# Patient Record
Sex: Male | Born: 1990 | Race: Black or African American | Hispanic: No | Marital: Single | Smoking: Current every day smoker
Health system: Southern US, Community
[De-identification: ages and names within clinical notes are randomized; demographics above are authoritative.]

---
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
## 2012-07-23 ENCOUNTER — Emergency Department: Payer: Self-pay | Admitting: Emergency Medicine

## 2012-07-23 LAB — URINALYSIS, COMPLETE
Bacteria: NONE SEEN
Bilirubin,UR: NEGATIVE
Glucose,UR: NEGATIVE mg/dL (ref 0–75)
Ketone: NEGATIVE
Leukocyte Esterase: NEGATIVE
Nitrite: NEGATIVE
Squamous Epithelial: <1

## 2013-03-11 ENCOUNTER — Emergency Department: Payer: Self-pay | Admitting: Internal Medicine

## 2013-03-11 LAB — HEMOGLOBIN: HGB: 14.6 g/dL (ref 13.0–18.0)

## 2013-03-19 ENCOUNTER — Emergency Department: Payer: Self-pay | Admitting: Emergency Medicine

## 2014-05-28 ENCOUNTER — Emergency Department
Admit: 2014-05-28 | Disposition: A | Discharge status: Home / Self Care | Payer: Self-pay | Admitting: Emergency Medicine

## 2014-12-26 ENCOUNTER — Encounter: Payer: Self-pay | Admitting: Emergency Medicine

## 2014-12-26 ENCOUNTER — Emergency Department
Admission: EM | Admit: 2014-12-26 | Discharge: 2014-12-26 | Disposition: A | Discharge status: Home / Self Care | Payer: No Typology Code available for payment source | Attending: Emergency Medicine | Admitting: Emergency Medicine

## 2014-12-26 ENCOUNTER — Emergency Department: Payer: No Typology Code available for payment source

## 2014-12-26 DIAGNOSIS — S6992XA Unspecified injury of left wrist, hand and finger(s), initial encounter: Secondary | ICD-10-CM | POA: Diagnosis present

## 2014-12-26 DIAGNOSIS — Y92410 Unspecified street and highway as the place of occurrence of the external cause: Secondary | ICD-10-CM | POA: Insufficient documentation

## 2014-12-26 DIAGNOSIS — Y9389 Activity, other specified: Secondary | ICD-10-CM | POA: Insufficient documentation

## 2014-12-26 DIAGNOSIS — Y998 Other external cause status: Secondary | ICD-10-CM | POA: Insufficient documentation

## 2014-12-26 DIAGNOSIS — F17210 Nicotine dependence, cigarettes, uncomplicated: Secondary | ICD-10-CM | POA: Diagnosis not present

## 2014-12-26 DIAGNOSIS — S0990XA Unspecified injury of head, initial encounter: Secondary | ICD-10-CM | POA: Diagnosis not present

## 2014-12-26 DIAGNOSIS — M25532 Pain in left wrist: Secondary | ICD-10-CM

## 2014-12-26 MED ORDER — OXYCODONE-ACETAMINOPHEN 5-325 MG PO TABS
2.0000 | ORAL_TABLET | Freq: Once | ORAL | Status: AC
  Administered 2014-12-26: 2 via ORAL

## 2014-12-26 MED ORDER — OXYCODONE-ACETAMINOPHEN 5-325 MG PO TABS: 1.0000 | ORAL_TABLET | ORAL | Status: DC | PRN

## 2014-12-26 NOTE — ED Notes (Signed)
Pt discharged to home, VSS,a febrile.  L wrist splint applied by MD.  Pt instructed on use.  Discharge paperwork and instructions reviewed with patient.  Pt provided prescription for percocet for pain.  Instructed on usage.  Pt verbalized understanding of instructions.
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
## 2014-12-26 NOTE — Discharge Instructions (Signed)
Wrist Pain There are many things that can cause wrist pain. Some common causes include:  An injury to the wrist area, such as a sprain, strain, or fracture.  Overuse of the joint.  A condition that causes increased pressure on a nerve in the wrist (carpal tunnel syndrome).  Wear and tear of the joints that occurs with aging (osteoarthritis).  A variety of other types of arthritis. Sometimes, the cause of wrist pain is not known. The pain often goes away when you follow your health care provider's instructions for relieving pain at home. If your wrist pain continues, tests may need to be done to diagnose your condition. HOME CARE INSTRUCTIONS Pay attention to any changes in your symptoms. Take these actions to help with your pain:  Rest the wrist area for at least 48 hours or as told by your health care provider.  If directed, apply ice to the injured area:  Put ice in a plastic bag.  Place a towel between your skin and the bag.  Leave the ice on for 20 minutes, 2-3 times per day.  Keep your arm raised (elevated) above the level of your heart while you are sitting or lying down.  If a splint or elastic bandage has been applied, use it as told by your health care provider.  Remove the splint or bandage only as told by your health care provider.  Loosen the splint or bandage if your fingers become numb or have a tingling feeling, or if they turn cold or blue.  Take over-the-counter and prescription medicines only as told by your health care provider.  Keep all follow-up visits as told by your health care provider. This is important. SEEK MEDICAL CARE IF:  Your pain is not helped by treatment.  Your pain gets worse. SEEK IMMEDIATE MEDICAL CARE IF:  Your fingers become swollen.  Your fingers turn white, very red, or cold and blue.  Your fingers are numb or have a tingling feeling.  You have difficulty moving your fingers.   This information is not intended to replace  advice given to you by your health care provider. Make sure you discuss any questions you have with your health care provider.   Document Released: 11/04/2004 Document Revised: 10/16/2014 Document Reviewed: 06/12/2014 Elsevier Interactive Patient Education 2016 [employer]  
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
## 2015-06-06 ENCOUNTER — Encounter: Payer: Self-pay | Admitting: Emergency Medicine

## 2015-06-06 ENCOUNTER — Emergency Department
Admission: EM | Admit: 2015-06-06 | Discharge: 2015-06-06 | Payer: No Typology Code available for payment source | Attending: Emergency Medicine | Admitting: Emergency Medicine

## 2015-06-06 DIAGNOSIS — F1310 Sedative, hypnotic or anxiolytic abuse, uncomplicated: Secondary | ICD-10-CM

## 2015-06-06 DIAGNOSIS — F13229 Sedative, hypnotic or anxiolytic dependence with intoxication, unspecified: Secondary | ICD-10-CM | POA: Insufficient documentation

## 2015-06-06 DIAGNOSIS — Z139 Encounter for screening, unspecified: Secondary | ICD-10-CM

## 2015-06-06 DIAGNOSIS — Z0000 Encounter for general adult medical examination without abnormal findings: Secondary | ICD-10-CM | POA: Insufficient documentation

## 2015-06-06 DIAGNOSIS — F17210 Nicotine dependence, cigarettes, uncomplicated: Secondary | ICD-10-CM | POA: Insufficient documentation

## 2015-06-06 NOTE — ED Notes (Signed)
Law enforcement states pt was at court and sentenced to 10 days in jail, pt was being examined by nurse and stated he smoked weed last night and took 3 Xanax. Pt is brought here for medical clearance to go to jail.
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
## 2015-11-08 ENCOUNTER — Emergency Department
Admission: EM | Admit: 2015-11-08 | Discharge: 2015-11-08 | Disposition: A | Discharge status: Home / Self Care | Payer: Self-pay | Attending: Emergency Medicine | Admitting: Emergency Medicine

## 2015-11-08 ENCOUNTER — Emergency Department: Payer: Self-pay

## 2015-11-08 DIAGNOSIS — Y999 Unspecified external cause status: Secondary | ICD-10-CM | POA: Insufficient documentation

## 2015-11-08 DIAGNOSIS — F17210 Nicotine dependence, cigarettes, uncomplicated: Secondary | ICD-10-CM | POA: Insufficient documentation

## 2015-11-08 DIAGNOSIS — S93402A Sprain of unspecified ligament of left ankle, initial encounter: Secondary | ICD-10-CM | POA: Insufficient documentation

## 2015-11-08 DIAGNOSIS — W010XXA Fall on same level from slipping, tripping and stumbling without subsequent striking against object, initial encounter: Secondary | ICD-10-CM | POA: Insufficient documentation

## 2015-11-08 DIAGNOSIS — Y92007 Garden or yard of unspecified non-institutional (private) residence as the place of occurrence of the external cause: Secondary | ICD-10-CM | POA: Insufficient documentation

## 2015-11-08 DIAGNOSIS — Y9302 Activity, running: Secondary | ICD-10-CM | POA: Insufficient documentation

## 2015-11-08 MED ORDER — OXYCODONE-ACETAMINOPHEN 5-325 MG PO TABS
2.0000 | ORAL_TABLET | Freq: Once | ORAL | Status: AC
  Administered 2015-11-08: 2 via ORAL
  Filled 2015-11-08: qty 2

## 2015-11-08 MED ORDER — OXYCODONE-ACETAMINOPHEN 5-325 MG PO TABS
1.0000 | ORAL_TABLET | Freq: Once | ORAL | Status: AC
  Administered 2015-11-08: 1 via ORAL

## 2015-11-08 MED ORDER — OXYCODONE-ACETAMINOPHEN 5-325 MG PO TABS
ORAL_TABLET | ORAL | Status: AC
  Administered 2015-11-08: 1 via ORAL
  Filled 2015-11-08: qty 1

## 2015-11-08 MED ORDER — IBUPROFEN 800 MG PO TABS
800.0000 mg | ORAL_TABLET | Freq: Once | ORAL | Status: AC
  Administered 2015-11-08: 800 mg via ORAL
  Filled 2015-11-08: qty 1

## 2015-11-08 MED ORDER — OXYCODONE-ACETAMINOPHEN 5-325 MG PO TABS: 2.0000 | ORAL_TABLET | Freq: Once | ORAL | Status: DC

## 2015-11-08 MED ORDER — HYDROCODONE-ACETAMINOPHEN 5-325 MG PO TABS: 1.0000 | ORAL_TABLET | ORAL | 0 refills | Status: DC | PRN

## 2015-11-08 MED ORDER — OXYCODONE-ACETAMINOPHEN 5-325 MG PO TABS: 1.0000 | ORAL_TABLET | Freq: Once | ORAL | Status: DC

## 2015-11-08 NOTE — ED Provider Notes (Signed)
[facility] Emergency Department Provider Note  ____________________________________________   First MD Initiated Contact with Patient 11/08/15 [phone]     (approximate)  I have reviewed the triage vital signs and the nursing notes.   HISTORY  Chief Complaint Ankle Pain    HPI Jerry Bartlett is a 25 y.o. male with no significant chronic medical problemsalthough he has had multiple visits in the past for orthopedic type injuries.  He presents complaining of severe "100 out of 10" pain in his left ankle.  He reports that he was walking or running through his backyard at 11 PM tonight when he tripped in a hole.  He has not been able to bear weight with his left foot since that time; movement and weightbearing make the pain worse.  He has some swelling to the outside part of the ankle.  He reports that he has throbbing pain throughout his entire foot and that it is extremely severe and that he needs pain medication immediately.  He was given a Percocet in triage it after he was placed in the exam room he was yelling out that he was in pain and needed pain medication and even sent his toddler-aged daughter out to ask for a doctor right away.   No past medical history on file.  There are no active problems to display for this patient.   No past surgical history on file.  Prior to Admission medications   Medication Sig Start Date End Date Taking? Authorizing Provider  HYDROcodone-acetaminophen (NORCO/VICODIN) 5-325 MG tablet Take 1-2 tablets by mouth every 4 (four) hours as needed for moderate pain. 11/08/15   Loleta Rose, MD  oxyCODONE-acetaminophen (PERCOCET/ROXICET) 5-325 MG tablet Take 1 tablet by mouth every 4 (four) hours as needed for severe pain. 12/26/14   Darci Current, MD    Allergies Review of patient's allergies indicates no known allergies.  No family history on file.  Social History Social History  Substance Use Topics  . Smoking status:  Current Every Day Smoker    Packs/day: 1.00    Types: Cigarettes  . Smokeless tobacco: Not on file  . Alcohol use No    Review of Systems Constitutional: No fever/chills Eyes: No visual changes. ENT: No sore throat. Cardiovascular: Denies chest pain. Respiratory: Denies shortness of breath. Gastrointestinal: No abdominal pain.  No nausea, no vomiting.  No diarrhea.  No constipation. Genitourinary: Negative for dysuria. Musculoskeletal: Severe pain in left ankle Skin: Negative for rash. Neurological: Negative for headaches, focal weakness or numbness.  10-point ROS otherwise negative.  ____________________________________________   PHYSICAL EXAM:  ED Triage Vitals  Enc Vitals Group     BP 11/08/15 0555 119/62     Pulse Rate 11/08/15 0555 (!) 119     Resp 11/08/15 0555 (!) 22     Temp 11/08/15 0555 98.5 F (36.9 C)     Temp Source 11/08/15 0555 Oral     SpO2 11/08/15 0555 98 %     Weight 11/08/15 0246 165 lb (74.8 kg)     Height 11/08/15 0246 6\' 3"  (1.905 m)     Head Circumference --      Peak Flow --      Pain Score 11/08/15 0246 10     Pain Loc --      Pain Edu? --      Excl. in GC? --      Constitutional: Alert and oriented. Well appearing In general but complaining of severe pain in his  left ankle and yelling loudly Eyes: Conjunctivae are normal. PERRL. EOMI. Head: Atraumatic. Cardiovascular: Normal rate, regular rhythm. Good peripheral circulation.  Respiratory: Normal respiratory effort.  No retractions.  Musculoskeletal: The left foot and ankle are normal in appearance except for a joint effusion of the left lateral malleolus.  There is tenderness to palpation throughout the left lateral malleolus.  There is no tenderness of the media he will malleolus.  There is no tenderness to palpation of the mid foot.  Capillary refill is normal and the patient is neurovascularly intact.  He jerks his foot and leg away whenever I tried to do anymore range of motion but  there is no laxity that I can appreciate of the foot or ankle.  Other extremities are unremarkable with no other evidence of trauma.  There are no lacerations or skin breaks that I can appreciate Neurologic:  Normal speech and language. No gross focal neurologic deficits are appreciated.  Skin:  Skin is warm, dry and intact. No rash noted. Psychiatric: Mood and affect are dramatic but he is alert and oriented and generally appropriate.  ____________________________________________   LABS (all labs ordered are listed, but only abnormal results are displayed)  Labs Reviewed - No data to display ____________________________________________  EKG  None - EKG not ordered by ED physician ____________________________________________  RADIOLOGY   Dg Ankle Complete Left  Result Date: 11/08/2015 CLINICAL DATA:  Acute onset of left lateral ankle pain after stepping in hole. Initial encounter. EXAM: LEFT ANKLE COMPLETE - 3+ VIEW COMPARISON:  Left foot radiographs performed 05/28/2014 FINDINGS: There is no evidence of fracture or dislocation. There is widening of the medial aspect of the ankle mortise, with slight lateral talar tilt, concerning for underlying ligamentous injury. The interosseous space is grossly preserved. A large ankle joint effusion is noted. No additional soft tissue abnormalities are seen. IMPRESSION: 1. No evidence of fracture or dislocation. 2. Widening of the medial aspect of the ankle mortise, with slight lateral talar tilt, concerning for underlying ligamentous injury. 3. Large ankle joint effusion noted. Electronically Signed   By: Roanna Raider M.D.   On: 11/08/2015 03:27    ____________________________________________   PROCEDURES  Procedure(s) performed:   Procedures   Critical Care performed: No ____________________________________________   INITIAL IMPRESSION / ASSESSMENT AND PLAN / ED COURSE  Pertinent labs & imaging results that were available during my  care of the patient were reviewed by me and considered in my medical decision making (see chart for details).  As per the radiologist interpretation there is no evidence of acute fracture or dislocation.  The radiologist interpretation of probable or possible ligamentous injury is consistent with my physical exam and the effusion on the lateral malleolus.  I gave the patient my usual and customary management recommendations of RICE and nonweightbearing until the pain improves.  I have given him another Percocet and 800 mg of ibuprofen along with an Ace wrap and crutches.  I encouraged him to follow up with orthopedics in about a week.  He is displeased about his persistent pain but I explained that he has a sprain and there is a limit to what I can do.  Prior to discharge he will have received Percocet x 3 and ibuprofen 800 mg, which should be sufficient for a ligamentous injury, and I also provided a prescription for Norco to help in the acute phase of recover after checking the [place] controlled substance database and seeing that he has not had a prescription filled  and approximately 11 months.   ____________________________________________  FINAL CLINICAL IMPRESSION(S) / ED DIAGNOSES  Final diagnoses:  Left ankle sprain, initial encounter     MEDICATIONS GIVEN DURING THIS VISIT:  Medications  oxyCODONE-acetaminophen (PERCOCET/ROXICET) 5-325 MG per tablet 1 tablet (1 tablet Oral Given 11/08/15 0253)  ibuprofen (ADVIL,MOTRIN) tablet 800 mg (800 mg Oral Given 11/08/15 0549)  oxyCODONE-acetaminophen (PERCOCET/ROXICET) 5-325 MG per tablet 2 tablet (2 tablets Oral Given 11/08/15 0549)     NEW OUTPATIENT MEDICATIONS STARTED DURING THIS VISIT:  Discharge Medication List as of 11/08/2015  5:47 AM    START taking these medications   Details  HYDROcodone-acetaminophen (NORCO/VICODIN) 5-325 MG tablet Take 1-2 tablets by mouth every 4 (four) hours as needed for moderate pain., Starting Sat  11/08/2015, Print        Discharge Medication List as of 11/08/2015  5:47 AM      Discharge Medication List as of 11/08/2015  5:47 AM       Note:  This document was prepared using Dragon voice recognition software and may include unintentional dictation errors.    Loleta Roseory Isa Kohlenberg, MD 11/08/15 [phone]

## 2015-11-08 NOTE — ED Notes (Signed)
Swelling noted to left lateral ankle.

## 2015-11-08 NOTE — Discharge Instructions (Signed)
As we discussed, your x-ray and physical exam suggests that they do not have any broken bones, you most likely have an ankle sprain (ligamentous injury).  Please read through the included information about ankle sprains and (RICE - rest, ice, compression, elevation).  Use crutches as needed, at least for the first week or so into your anchors starts feeling better.  We recommend that you call and schedule a follow-up visit with Dr. Hyacinth MeekerMiller or one of his colleagues in the orthopedics clinic to make sure your healing well.  Typically they will want to to wait at least a week before following up to allow the swelling to go down.  Please use over-the-counter medication such as ibuprofen and Tylenol according to label instructions.  Take Norco as prescribed for severe pain. Do not drink alcohol, drive or participate in any other potentially dangerous activities while taking this medication as it may make you sleepy. Do not take this medication with any other sedating medications, either prescription or over-the-counter. If you were prescribed Percocet or Vicodin, do not take these with acetaminophen (Tylenol) as it is already contained within these medications.   This medication is an opiate (or narcotic) pain medication and can be habit forming.  Use it as little as possible to achieve adequate pain control.  Do not use or use it with extreme caution if you have a history of opiate abuse or dependence.  If you are on a pain contract with your primary care doctor or a pain specialist, be sure to let them know you were prescribed this medication today from the [facility].  This medication is intended for your use only - do not give any to anyone else and keep it in a secure place where nobody else, especially children, have access to it.  It will also cause or worsen constipation, so you may want to consider taking an over-the-counter stool softener while you are taking this medication.

## 2015-11-08 NOTE — ED Notes (Signed)
Pt awoken from sleep to assess ankle. Pt states "I'm at a 100 pain level over 10." pt encouraged to place ice on painful ankle. Pt states "that makes it worser" then falls back to sleep.

## 2015-11-08 NOTE — ED Notes (Signed)
Ice pack placed on injured extremity.  Patient taken to waiting room placed in front of chair, explained for patient to place injured foot/ankle up on chair (pillow) placed on chair.  Explained to patient need for elevation.

## 2015-11-08 NOTE — ED Triage Notes (Signed)
Reports he was running and fell.  Patient complains of left ankle pain.  Occurred at 11 pm.

## 2016-03-10 ENCOUNTER — Encounter: Payer: Self-pay | Admitting: *Deleted

## 2016-03-10 ENCOUNTER — Emergency Department
Admission: EM | Admit: 2016-03-10 | Discharge: 2016-03-10 | Disposition: A | Discharge status: Home / Self Care | Payer: Self-pay | Attending: Emergency Medicine | Admitting: Emergency Medicine

## 2016-03-10 DIAGNOSIS — L0201 Cutaneous abscess of face: Secondary | ICD-10-CM | POA: Insufficient documentation

## 2016-03-10 DIAGNOSIS — F17210 Nicotine dependence, cigarettes, uncomplicated: Secondary | ICD-10-CM | POA: Insufficient documentation

## 2016-03-10 MED ORDER — SULFAMETHOXAZOLE-TRIMETHOPRIM 800-160 MG PO TABS: 1.0000 | ORAL_TABLET | Freq: Two times a day (BID) | ORAL | 0 refills | Status: DC

## 2016-03-10 MED ORDER — LIDOCAINE-EPINEPHRINE (PF) 2 %-1:200000 IJ SOLN
20.0000 mL | Freq: Once | INTRAMUSCULAR | Status: AC
  Administered 2016-03-10: 20 mL
  Filled 2016-03-10: qty 20

## 2016-03-10 MED ORDER — HYDROCODONE-ACETAMINOPHEN 5-325 MG PO TABS: 1.0000 | ORAL_TABLET | ORAL | 0 refills | Status: AC | PRN

## 2016-03-10 NOTE — Discharge Instructions (Signed)
Take the antibiotic until finished. Use a warm compress several times per day to help the area drain.  Return to the ER for symptoms that change are not improving over 2 days or sooner for symptoms that worsen.

## 2016-03-10 NOTE — ED Provider Notes (Signed)
[facility] Emergency Department Provider Note  ____________________________________________  Time seen: Approximately 12:30 PM  I have reviewed the triage vital signs and the nursing notes.   HISTORY  Chief Complaint Abscess   HPI Jerry Bartlett is a 26 y.o. male who presents to the emergency department for evaluation of skin infection/abscess on his chin. Symptoms started 3 days ago. No previous skin infections. No fever.  History reviewed. No pertinent past medical history.  There are no active problems to display for this patient.   History reviewed. No pertinent surgical history.  Prior to Admission medications   Medication Sig Start Date End Date Taking? Authorizing Provider  HYDROcodone-acetaminophen (NORCO/VICODIN) 5-325 MG tablet Take 1 tablet by mouth every 4 (four) hours as needed for moderate pain. 03/10/16 03/10/17  Chinita Pester, FNP  sulfamethoxazole-trimethoprim (BACTRIM DS,SEPTRA DS) 800-160 MG tablet Take 1 tablet by mouth 2 (two) times daily. 03/10/16   Chinita Pester, FNP    Allergies Patient has no known allergies.  History reviewed. No pertinent family history.  Social History Social History  Substance Use Topics  . Smoking status: Current Every Day Smoker    Packs/day: 1.00    Types: Cigarettes  . Smokeless tobacco: Not on file  . Alcohol use No    Review of Systems  Constitutional: Negative for fever/chills Respiratory: Negative for shortness of breath. Musculoskeletal: Negative for pain. Skin: Positive for abscess. Neurological: Negative for headaches, focal weakness or numbness. ____________________________________________   PHYSICAL EXAM:  VITAL SIGNS: ED Triage Vitals [03/10/16 1146]  Enc Vitals Group     BP 123/60     Pulse Rate 72     Resp 18     Temp 97.7 F (36.5 C)     Temp Source Oral     SpO2 100 %     Weight 165 lb (74.8 kg)     Height 6\' 2"  (1.88 m)     Head Circumference      Peak  Flow      Pain Score 8     Pain Loc      Pain Edu?      Excl. in GC?      Constitutional: Alert and oriented. Well appearing and in no acute distress. Eyes: Conjunctivae are normal. EOMI. Nose: No congestion/rhinnorhea. Mouth/Throat: Mucous membranes are moist.   Neck: No stridor. Lymphatic: No cervical lymphadenopathy. Cardiovascular: Good peripheral circulation. Respiratory: Normal respiratory effort.  No retractions. Musculoskeletal: FROM throughout. Neurologic:  Normal speech and language. No gross focal neurologic deficits are appreciated. Skin:  Indurated, fluctuant area under the apex of the mandible inside beard line.  ____________________________________________   LABS (all labs ordered are listed, but only abnormal results are displayed)  Labs Reviewed - No data to display ____________________________________________  EKG   ____________________________________________  RADIOLOGY  Not indicated. ____________________________________________   PROCEDURES  Procedure(s) performed:  INCISION AND DRAINAGE Performed by: Kem Boroughs Consent: Verbal consent obtained. Risks and benefits: risks, benefits and alternatives were discussed Type: abscess  Body area: Apex of chin  Anesthesia: local infiltration  Incision was made with a scalpel.  Local anesthetic: lidocaine 2% with epinephrine  Anesthetic total: 3 ml  Complexity: complex Blunt dissection to break up loculations  Drainage: purulent  Drainage amount: large  Patient tolerance: Patient tolerated the procedure well with no immediate complications.    ____________________________________________   INITIAL IMPRESSION / ASSESSMENT AND PLAN / ED COURSE     Pertinent labs & imaging results that were  available during my care of the patient were reviewed by me and considered in my medical decision making (see chart for details).  26 year old male who presents to the emergency department  for evaluation of abscess to chin that has been present for the past 3 days. I&D successfully performed while in the ER. He will be given Bactrim and norco. He was advised to return to the ER for symptoms that are not improving over the next 2 days. He was advised to use warm compresses and take the antibiotic and pain medication as prescribed.  ____________________________________________   FINAL CLINICAL IMPRESSION(S) / ED DIAGNOSES  Final diagnoses:  Facial abscess    Discharge Medication List as of 03/10/2016  1:10 PM    START taking these medications   Details  sulfamethoxazole-trimethoprim (BACTRIM DS,SEPTRA DS) 800-160 MG tablet Take 1 tablet by mouth 2 (two) times daily., Starting Wed 03/10/2016, Print        Note:  This document was prepared using Dragon voice recognition software and may include unintentional dictation errors.    Chinita PesterCari B Rori Goar, FNP 03/10/16 1549    Arnaldo NatalPaul F Malinda, MD 03/11/16 2228

## 2016-03-10 NOTE — ED Triage Notes (Signed)
States abscess on his chin for 3 days

## 2019-02-14 ENCOUNTER — Emergency Department
Admission: EM | Admit: 2019-02-14 | Discharge: 2019-02-14 | Disposition: A | Discharge status: Home / Self Care | Payer: Self-pay | Attending: Emergency Medicine | Admitting: Emergency Medicine

## 2019-02-14 ENCOUNTER — Other Ambulatory Visit: Payer: Self-pay

## 2019-02-14 DIAGNOSIS — F1210 Cannabis abuse, uncomplicated: Secondary | ICD-10-CM | POA: Insufficient documentation

## 2019-02-14 DIAGNOSIS — L0291 Cutaneous abscess, unspecified: Secondary | ICD-10-CM

## 2019-02-14 DIAGNOSIS — F17210 Nicotine dependence, cigarettes, uncomplicated: Secondary | ICD-10-CM | POA: Insufficient documentation

## 2019-02-14 DIAGNOSIS — L02214 Cutaneous abscess of groin: Secondary | ICD-10-CM | POA: Insufficient documentation

## 2019-02-14 MED ORDER — SULFAMETHOXAZOLE-TRIMETHOPRIM 800-160 MG PO TABS: 1.0000 | ORAL_TABLET | Freq: Two times a day (BID) | ORAL | 0 refills | Status: DC

## 2019-02-14 MED ORDER — NAPROXEN 500 MG PO TABS: 500.0000 mg | ORAL_TABLET | Freq: Two times a day (BID) | ORAL | Status: DC

## 2019-02-14 NOTE — Discharge Instructions (Addendum)
Follow discharge care instructions and take medication as directed. 

## 2019-02-14 NOTE — ED Provider Notes (Signed)
[facility] Emergency Department Provider Note   ____________________________________________   First MD Initiated Contact with Patient 02/14/19 1347     (approximate)  I have reviewed the triage vital signs and the nursing notes.   HISTORY  Chief Complaint Abscess    HPI Jerry Bartlett is a 29 y.o. male patient complain nodular lesion to the right inguinal area for approximately 5 days.  Patient state last night it was slight drainage from the lesion.  Patient denies fever associated complaint.  Patient had a similar lesion 6 months ago but admits to noncompliance of antibiotic medication.  Patient rates pain as a 7/10.  Patient described pain as "achy".  No palliative measures for complaint.         History reviewed. No pertinent past medical history.  There are no problems to display for this patient.   History reviewed. No pertinent surgical history.  Prior to Admission medications   Medication Sig Start Date End Date Taking? Authorizing Provider  naproxen (NAPROSYN) 500 MG tablet Take 1 tablet (500 mg total) by mouth 2 (two) times daily with a meal. 02/14/19   Joni Reining, PA-C  sulfamethoxazole-trimethoprim (BACTRIM DS) 800-160 MG tablet Take 1 tablet by mouth 2 (two) times daily. 02/14/19   Joni Reining, PA-C  sulfamethoxazole-trimethoprim (BACTRIM DS,SEPTRA DS) 800-160 MG tablet Take 1 tablet by mouth 2 (two) times daily. 03/10/16   Chinita Pester, FNP    Allergies Patient has no known allergies.  No family history on file.  Social History Social History   Tobacco Use  . Smoking status: Current Every Day Smoker    Packs/day: 1.00    Types: Cigarettes  . Smokeless tobacco: Never Used  Substance Use Topics  . Alcohol use: No  . Drug use: Yes    Types: Marijuana    Review of Systems Constitutional: No fever/chills Eyes: No visual changes. ENT: No sore throat. Cardiovascular: Denies chest pain. Respiratory: Denies  shortness of breath. Gastrointestinal: No abdominal pain.  No nausea, no vomiting.  No diarrhea.  No constipation. Genitourinary: Negative for dysuria. Musculoskeletal: Negative for back pain. Skin: Nodule lesion right inguinal area.   Neurological: Negative for headaches, focal weakness or numbness.   ____________________________________________   PHYSICAL EXAM:  VITAL SIGNS: ED Triage Vitals  Enc Vitals Group     BP 02/14/19 1337 132/67     Pulse Rate 02/14/19 1337 97     Resp 02/14/19 1337 16     Temp 02/14/19 1337 98.3 F (36.8 C)     Temp Source 02/14/19 1337 Oral     SpO2 02/14/19 1337 98 %     Weight 02/14/19 1338 175 lb (79.4 kg)     Height 02/14/19 1338 6\' 2"  (1.88 m)     Head Circumference --      Peak Flow --      Pain Score 02/14/19 1338 7     Pain Loc --      Pain Edu? --      Excl. in GC? --    Constitutional: Alert and oriented. Well appearing and in no acute distress. Hematological/Lymphatic/Immunilogical: Left inguinal lymphadenopathy. Cardiovascular: Normal rate, regular rhythm. Grossly normal heart sounds.  Good peripheral circulation. Respiratory: Normal respiratory effort.  No retractions. Lungs CTAB. Neurologic:  Normal speech and language. No gross focal neurologic deficits are appreciated. No gait instability. Skin:  Skin is warm, dry and intact.  Nodule lesion left inguinal area. Psychiatric: Mood and affect are normal.  Speech and behavior are normal.  ____________________________________________   LABS (all labs ordered are listed, but only abnormal results are displayed)  Labs Reviewed - No data to display ____________________________________________  EKG   ____________________________________________  RADIOLOGY  ED MD interpretation:    Official radiology report(s): No results found.  ____________________________________________   PROCEDURES  Procedure(s) performed (including Critical Care):  Procedures    ____________________________________________   INITIAL IMPRESSION / ASSESSMENT AND PLAN / ED COURSE  As part of my medical decision making, I reviewed the following data within the [facility]      Patient presents with nodule lesion right inguinal area consistent with abscess.  Area is nonfluctuant.  Discussed rationale for not incised and drained at this time.  Patient given discharge care instruction a prescription for Bactrim and naproxen.  Patient advised [facility].   Jerry Bartlett was evaluated in Emergency Department on 02/14/2019 for the symptoms described in the history of present illness. He was evaluated in the context of the global COVID-19 pandemic, which necessitated consideration that the patient might be at risk for infection with the SARS-CoV-2 virus that causes COVID-19. Institutional protocols and algorithms that pertain to the evaluation of patients at risk for COVID-19 are in a state of rapid change based on information released by regulatory bodies including the CDC and federal and state organizations. These policies and algorithms were followed during the patient's care in the ED.       ____________________________________________   FINAL CLINICAL IMPRESSION(S) / ED DIAGNOSES  Final diagnoses:  Abscess     ED Discharge Orders         Ordered    sulfamethoxazole-trimethoprim (BACTRIM DS) 800-160 MG tablet  2 times daily     02/14/19 1436    naproxen (NAPROSYN) 500 MG tablet  2 times daily with meals     02/14/19 1436           Note:  This document was prepared using Dragon voice recognition software and may include unintentional dictation errors.    Sable Feil, PA-C 02/14/19 1441    Carrie Mew, MD 02/15/19 [phone]

## 2019-02-14 NOTE — ED Triage Notes (Signed)
Pt c/o an abscess to the left inguinal area for the past 4-5 days with drainage.

## 2019-08-16 ENCOUNTER — Telehealth: Payer: Self-pay | Admitting: General Practice

## 2019-08-21 ENCOUNTER — Telehealth: Payer: Self-pay | Admitting: General Practice

## 2019-08-21 NOTE — Telephone Encounter (Signed)
Individual has been contacted 3+ times regarding ED referral. No further attempts to contact individual will be made. 

## 2019-12-08 ENCOUNTER — Emergency Department: Payer: No Typology Code available for payment source

## 2019-12-08 ENCOUNTER — Other Ambulatory Visit: Payer: Self-pay

## 2019-12-08 ENCOUNTER — Emergency Department
Admission: EM | Admit: 2019-12-08 | Discharge: 2019-12-08 | Disposition: A | Discharge status: Home / Self Care | Payer: No Typology Code available for payment source | Attending: Emergency Medicine | Admitting: Emergency Medicine

## 2019-12-08 DIAGNOSIS — M25512 Pain in left shoulder: Secondary | ICD-10-CM | POA: Insufficient documentation

## 2019-12-08 DIAGNOSIS — F17210 Nicotine dependence, cigarettes, uncomplicated: Secondary | ICD-10-CM | POA: Insufficient documentation

## 2019-12-08 DIAGNOSIS — R519 Headache, unspecified: Secondary | ICD-10-CM | POA: Diagnosis present

## 2019-12-08 DIAGNOSIS — M25552 Pain in left hip: Secondary | ICD-10-CM | POA: Insufficient documentation

## 2019-12-08 DIAGNOSIS — M542 Cervicalgia: Secondary | ICD-10-CM | POA: Diagnosis not present

## 2019-12-08 DIAGNOSIS — Y92411 Interstate highway as the place of occurrence of the external cause: Secondary | ICD-10-CM | POA: Insufficient documentation

## 2019-12-08 DIAGNOSIS — M25522 Pain in left elbow: Secondary | ICD-10-CM | POA: Diagnosis not present

## 2019-12-08 MED ORDER — CYCLOBENZAPRINE HCL 5 MG PO TABS: ORAL_TABLET | ORAL | 0 refills | Status: DC
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
## 2020-07-10 ENCOUNTER — Emergency Department
Admission: EM | Admit: 2020-07-10 | Discharge: 2020-07-10 | Disposition: A | Discharge status: Home / Self Care | Payer: Self-pay | Attending: Emergency Medicine | Admitting: Emergency Medicine

## 2020-07-10 ENCOUNTER — Encounter: Payer: Self-pay | Admitting: Emergency Medicine

## 2020-07-10 ENCOUNTER — Other Ambulatory Visit: Payer: Self-pay

## 2020-07-10 DIAGNOSIS — L739 Follicular disorder, unspecified: Secondary | ICD-10-CM

## 2020-07-10 DIAGNOSIS — L662 Folliculitis decalvans: Secondary | ICD-10-CM | POA: Insufficient documentation

## 2020-07-10 DIAGNOSIS — F17210 Nicotine dependence, cigarettes, uncomplicated: Secondary | ICD-10-CM | POA: Insufficient documentation

## 2020-07-10 DIAGNOSIS — W57XXXA Bitten or stung by nonvenomous insect and other nonvenomous arthropods, initial encounter: Secondary | ICD-10-CM | POA: Insufficient documentation

## 2020-07-10 MED ORDER — CEPHALEXIN 500 MG PO CAPS: 500.0000 mg | ORAL_CAPSULE | Freq: Three times a day (TID) | ORAL | 0 refills | Status: AC

## 2020-07-10 MED ORDER — MUPIROCIN CALCIUM 2 % EX CREA: TOPICAL_CREAM | CUTANEOUS | 0 refills | Status: AC

## 2020-07-10 NOTE — ED Notes (Signed)
Patient denies needs, questions, or concerns about discharge instructions or follow up care. Patient provided discharge papers by this RN. Patient ambulatory with steady gait.

## 2020-07-10 NOTE — Discharge Instructions (Signed)
Take Keflex three times daily for seven days.  Apply Mupirocin twice daily for seven days.

## 2020-07-10 NOTE — ED Provider Notes (Signed)
[facility]-EMERGENCY DEPARTMENT  ____________________________________________  Time seen: Approximately 4:03 PM  I have reviewed the triage vital signs and the nursing notes.   HISTORY  Chief Complaint Insect Bite   Historian Patient     HPI Jerry Bartlett is a 30 y.o. male presents to the emergency department with 2 regions of folliculitis.  Patient has 1 region along right inner foot and one along left inner thigh.  He states that areas have been present for the past 1 and half weeks.  Patient has been scratching at the affected areas.  No alleviating measures have been attempted at home.   History reviewed. No pertinent past medical history.   Immunizations up to date:  Yes.     History reviewed. No pertinent past medical history.  There are no problems to display for this patient.   History reviewed. No pertinent surgical history.  Prior to Admission medications   Medication Sig Start Date End Date Taking? Authorizing Provider  cephALEXin (KEFLEX) 500 MG capsule Take 1 capsule (500 mg total) by mouth 3 (three) times daily for 7 days. 07/10/20 07/17/20 Yes Pia Mau M, PA-C  mupirocin cream (BACTROBAN) 2 % Apply to affected area 3 times daily 07/10/20 07/10/21 Yes Joseph Art, Lockie Pares M, PA-C  cyclobenzaprine (FLEXERIL) 5 MG tablet Take 1-2 tablets 3 times daily as needed 12/08/19   Enid Derry, PA-C  naproxen (NAPROSYN) 500 MG tablet Take 1 tablet (500 mg total) by mouth 2 (two) times daily with a meal. 02/14/19   Joni Reining, PA-C  sulfamethoxazole-trimethoprim (BACTRIM DS) 800-160 MG tablet Take 1 tablet by mouth 2 (two) times daily. 02/14/19   Joni Reining, PA-C  sulfamethoxazole-trimethoprim (BACTRIM DS,SEPTRA DS) 800-160 MG tablet Take 1 tablet by mouth 2 (two) times daily. 03/10/16   Chinita Pester, FNP    Allergies Patient has no known allergies.  History reviewed. No pertinent family history.  Social History Social History   Tobacco Use  . Smoking status:  Current Every Day Smoker    Packs/day: 1.00    Types: Cigarettes  . Smokeless tobacco: Never Used  Substance Use Topics  . Alcohol use: No  . Drug use: Yes    Types: Marijuana     Review of Systems  Constitutional: No fever/chills Eyes:  No discharge ENT: No upper respiratory complaints. Respiratory: no cough. No SOB/ use of accessory muscles to breath Gastrointestinal:   No nausea, no vomiting.  No diarrhea.  No constipation. Musculoskeletal: Negative for musculoskeletal pain. Skin: Patient has rash.     ____________________________________________   PHYSICAL EXAM:  VITAL SIGNS: ED Triage Vitals  Enc Vitals Group     BP 07/10/20 1513 122/78     Pulse Rate 07/10/20 1513 71     Resp 07/10/20 1513 18     Temp 07/10/20 1513 98.4 F (36.9 C)     Temp Source 07/10/20 1513 Oral     SpO2 07/10/20 1513 98 %     Weight 07/10/20 1443 173 lb (78.5 kg)     Height 07/10/20 1443 6\' 3"  (1.905 m)     Head Circumference --      Peak Flow --      Pain Score 07/10/20 1443 9     Pain Loc --      Pain Edu? --      Excl. in GC? --      Constitutional: Alert and oriented. Well appearing and in no acute distress. Eyes: Conjunctivae are normal. PERRL. EOMI. Head: Atraumatic.  ENT: Cardiovascular: Normal rate, regular rhythm. Normal S1 and S2.  Good peripheral circulation. Respiratory: Normal respiratory effort without tachypnea or retractions. Lungs CTAB. Good air entry to the bases with no decreased or absent breath sounds Gastrointestinal: Bowel sounds x 4 quadrants. Soft and nontender to palpation. No guarding or rigidity. No distention. Musculoskeletal: Full range of motion to all extremities. No obvious deformities noted Neurologic:  Normal for age. No gross focal neurologic deficits are appreciated.  Skin: Patient has 2 regions of folliculitis along right inner foot and left thigh.  Affected areas are erythematous with no palpable fluctuance or induration. Psychiatric: Mood and  affect are normal for age. Speech and behavior are normal.   ____________________________________________   LABS (all labs ordered are listed, but only abnormal results are displayed)  Labs Reviewed - No data to display ____________________________________________  EKG   ____________________________________________  RADIOLOGY   No results found.  ____________________________________________    PROCEDURES  Procedure(s) performed:     Procedures     Medications - No data to display   ____________________________________________   INITIAL IMPRESSION / ASSESSMENT AND PLAN / ED COURSE  Pertinent labs & imaging results that were available during my care of the patient were reviewed by me and considered in my medical decision making (see chart for details).     Assessment and plan Folliculitis 30 year old male presents to the urgent care with 2 regions of folliculitis.  Patient was discharged with Keflex and mupirocin.  Return precautions were given to return with new or worsening symptoms. All patient questions were answered.       ____________________________________________  FINAL CLINICAL IMPRESSION(S) / ED DIAGNOSES  Final diagnoses:  Folliculitis      NEW MEDICATIONS STARTED DURING THIS VISIT:  ED Discharge Orders         Ordered    mupirocin cream (BACTROBAN) 2 %        07/10/20 1532    cephALEXin (KEFLEX) 500 MG capsule  3 times daily        07/10/20 1532              This chart was dictated using voice recognition software/Dragon. Despite best efforts to proofread, errors can occur which can change the meaning. Any change was purely unintentional.     Orvil Feil, PA-C 07/10/20 1606    Phineas Semen, MD 07/10/20 Windy Fast

## 2020-07-10 NOTE — ED Triage Notes (Signed)
Pt to ED via POV with c/o insect bite to R inner foot and L inner thigh. Pt states has been ongoing x 1.5 weeks.

## 2020-11-13 ENCOUNTER — Emergency Department
Admission: EM | Admit: 2020-11-13 | Discharge: 2020-11-13 | Disposition: A | Discharge status: Home / Self Care | Payer: Self-pay | Attending: Emergency Medicine | Admitting: Emergency Medicine

## 2020-11-13 ENCOUNTER — Other Ambulatory Visit: Payer: Self-pay

## 2020-11-13 ENCOUNTER — Emergency Department: Payer: Self-pay

## 2020-11-13 DIAGNOSIS — Y92410 Unspecified street and highway as the place of occurrence of the external cause: Secondary | ICD-10-CM | POA: Insufficient documentation

## 2020-11-13 DIAGNOSIS — S81012A Laceration without foreign body, left knee, initial encounter: Secondary | ICD-10-CM | POA: Insufficient documentation

## 2020-11-13 DIAGNOSIS — F17210 Nicotine dependence, cigarettes, uncomplicated: Secondary | ICD-10-CM | POA: Insufficient documentation

## 2020-11-13 MED ORDER — LIDOCAINE-EPINEPHRINE (PF) 2 %-1:200000 IJ SOLN
10.0000 mL | Freq: Once | INTRAMUSCULAR | Status: AC
  Administered 2020-11-13: 10 mL

## 2020-11-13 MED ORDER — CEPHALEXIN 500 MG PO CAPS: 1000.0000 mg | ORAL_CAPSULE | Freq: Two times a day (BID) | ORAL | 0 refills | Status: DC
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
## 2021-07-04 ENCOUNTER — Emergency Department
Admission: EM | Admit: 2021-07-04 | Discharge: 2021-07-04 | Disposition: A | Discharge status: Home / Self Care | Payer: Self-pay | Attending: Emergency Medicine | Admitting: Emergency Medicine

## 2021-07-04 DIAGNOSIS — K047 Periapical abscess without sinus: Secondary | ICD-10-CM | POA: Insufficient documentation

## 2021-07-04 MED ORDER — HYDROCODONE-ACETAMINOPHEN 5-325 MG PO TABS: 1.0000 | ORAL_TABLET | ORAL | 0 refills | Status: DC | PRN

## 2021-07-04 MED ORDER — LIDOCAINE HCL (PF) 1 % IJ SOLN
5.0000 mL | Freq: Once | INTRAMUSCULAR | Status: AC
  Administered 2021-07-04: 5 mL
  Filled 2021-07-04: qty 5

## 2021-07-04 MED ORDER — PENICILLIN V POTASSIUM 500 MG PO TABS: 500.0000 mg | ORAL_TABLET | Freq: Four times a day (QID) | ORAL | 0 refills | Status: DC

## 2021-07-04 MED ORDER — PENICILLIN V POTASSIUM 500 MG PO TABS
500.0000 mg | ORAL_TABLET | Freq: Once | ORAL | Status: AC
  Administered 2021-07-04: 500 mg via ORAL
  Filled 2021-07-04: qty 1

## 2021-07-04 NOTE — Discharge Instructions (Addendum)
Please take your antibiotic as prescribed for its entire course.  Please take your pain medication as needed for severe pain but only as written.  Do not drink alcohol or drive while taking this medication.  It is very importantly follow-up with a dentist this week.  Please see the list below of some local dental clinics.  As we discussed return to the emergency department for any fever or any increase swelling.

## 2021-07-04 NOTE — ED Provider Notes (Addendum)
[facility] Provider Note    Event Date/Time   First MD Initiated Contact with Patient 07/04/21 1847     (approximate)  History   Chief Complaint: Jaw Pain  HPI  Jerry Bartlett is a 31 y.o. male with no past medical history presents to the emergency department for left lower jaw pain x2 to 3 days.  According to the patient for the past 2 to 3 days he has had pain in his left lower jaw/teeth and now has developed swelling and increased pain today.  No fever.  No drainage.  Physical Exam   Triage Vital Signs: ED Triage Vitals  Enc Vitals Group     BP 07/04/21 1647 118/75     Pulse Rate 07/04/21 1646 60     Resp 07/04/21 1646 16     Temp 07/04/21 1646 97.7 F (36.5 C)     Temp Source 07/04/21 1646 Oral     SpO2 07/04/21 1646 100 %     Weight 07/04/21 1646 175 lb (79.4 kg)     Height 07/04/21 1646 6\' 2"  (1.88 m)     Head Circumference --      Peak Flow --      Pain Score 07/04/21 1646 8     Pain Loc --      Pain Edu? --      Excl. in GC? --     Most recent vital signs: Vitals:   07/04/21 1646 07/04/21 1647  BP:  118/75  Pulse: 60   Resp: 16   Temp: 97.7 F (36.5 C)   SpO2: 100%     General: Awake, no distress.  CV:  Good peripheral perfusion.  Regular rate and rhythm  Resp:  Normal effort.  Equal breath sounds bilaterally.  Abd:  No distention.  Soft, nontender.  No rebound or guarding. Other:  Patient has moderate swelling to the base of his left lower molars with some fluctuance consistent with likely small dental abscess.  No drainage.    MEDICATIONS ORDERED IN ED: Medications  lidocaine (PF) (XYLOCAINE) 1 % injection 5 mL (has no administration in time range)  penicillin v potassium (VEETID) tablet 500 mg (has no administration in time range)     IMPRESSION / MDM / ASSESSMENT AND PLAN / ED COURSE  I reviewed the triage vital signs and the nursing notes.  Patient presents emergency department for left lower dental pain.   Patient appears to have a small dental abscess on examination.  We will attempt to incise and drain the abscess.  We will place patient on penicillin 4 times daily x10 days.  Patient will follow-up with a dentist.  I was able to drain a small amount of pus.  We will place patient on penicillin.  Patient did not want to attempt any further drainage.  Patient given penicillin in the emergency department.  I discussed very strict return precautions if the swelling is to worsen patient needs to return to the emergency department otherwise he will follow-up with a dentist this week.   INCISION AND DRAINAGE Performed by: 07/06/21 Consent: Verbal consent obtained. Risks and benefits: risks, benefits and alternatives were discussed Type: abscess  Body area: Left lower molar  Anesthesia: local infiltration  Incision was made with 18-gauge needle  Local anesthetic: lidocaine 1% without epinephrine  Anesthetic total: 1 ml  Complexity: Temple  Drainage: purulent  Drainage amount: 1 to 2 cc     FINAL CLINICAL IMPRESSION(S) / ED  DIAGNOSES   Dental infection Dental abscess  Rx / DC Orders   Penicillin Dental follow-up  Note:  This document was prepared using Dragon voice recognition software and may include unintentional dictation errors.   Minna Antis, MD 07/04/21 Prentice Docker    Minna Antis, MD 07/04/21 [phone]

## 2021-07-04 NOTE — ED Triage Notes (Signed)
Patient to ER via Pov with complaints of left sided dental abscess and pain. Reports his girlfriend gave him a "strip for pain", and when he woke up this morning his mouth was more swollen. Denies drainage.

## 2021-07-05 ENCOUNTER — Telehealth: Payer: Self-pay | Admitting: Family Medicine

## 2021-07-05 MED ORDER — HYDROCODONE-ACETAMINOPHEN 5-325 MG PO TABS: 1.0000 | ORAL_TABLET | ORAL | 0 refills | Status: AC | PRN

## 2021-07-05 MED ORDER — PENICILLIN V POTASSIUM 500 MG PO TABS: 500.0000 mg | ORAL_TABLET | Freq: Four times a day (QID) | ORAL | 0 refills | Status: DC

## 2021-07-05 NOTE — Telephone Encounter (Cosign Needed)
[facility] [address] is closed. Patient called to request that Rx be sent to another pharmacy.

## 2021-10-14 ENCOUNTER — Other Ambulatory Visit: Payer: Self-pay

## 2021-10-14 ENCOUNTER — Encounter: Payer: Self-pay | Admitting: Emergency Medicine

## 2021-10-14 ENCOUNTER — Emergency Department
Admission: EM | Admit: 2021-10-14 | Discharge: 2021-10-14 | Disposition: A | Discharge status: Home / Self Care | Payer: Self-pay | Attending: Emergency Medicine | Admitting: Emergency Medicine

## 2021-10-14 ENCOUNTER — Emergency Department: Payer: Self-pay

## 2021-10-14 DIAGNOSIS — L02214 Cutaneous abscess of groin: Secondary | ICD-10-CM

## 2021-10-14 DIAGNOSIS — N5089 Other specified disorders of the male genital organs: Secondary | ICD-10-CM

## 2021-10-14 DIAGNOSIS — L02215 Cutaneous abscess of perineum: Secondary | ICD-10-CM | POA: Insufficient documentation

## 2021-10-14 DIAGNOSIS — L0291 Cutaneous abscess, unspecified: Secondary | ICD-10-CM

## 2021-10-14 LAB — COMPREHENSIVE METABOLIC PANEL
ALT: 8 U/L (ref 0–44)
AST: 17 U/L (ref 15–41)
Albumin: 3.8 g/dL (ref 3.5–5.0)
Alkaline Phosphatase: 59 U/L (ref 38–126)
Anion gap: 7 (ref 5–15)
BUN: 7 mg/dL (ref 6–20)
CO2: 28 mmol/L (ref 22–32)
Calcium: 9.3 mg/dL (ref 8.9–10.3)
Chloride: 105 mmol/L (ref 98–111)
Creatinine, Ser: 0.97 mg/dL (ref 0.61–1.24)
GFR, Estimated: >60 mL/min (ref >60)
Glucose, Bld: 80 mg/dL (ref 70–99)
Potassium: 4 mmol/L (ref 3.5–5.1)
Sodium: 140 mmol/L (ref 135–145)
Total Bilirubin: 0.5 mg/dL (ref 0.3–1.2)
Total Protein: 7.3 g/dL (ref 6.5–8.1)

## 2021-10-14 LAB — URINALYSIS, ROUTINE W REFLEX MICROSCOPIC
Bilirubin Urine: NEGATIVE
Glucose, UA: NEGATIVE mg/dL
Hgb urine dipstick: NEGATIVE
Ketones, ur: NEGATIVE mg/dL
Leukocytes,Ua: NEGATIVE
Nitrite: NEGATIVE
Protein, ur: 30 mg/dL — AB
Specific Gravity, Urine: 1.031 — ABNORMAL HIGH (ref 1.005–1.030)
pH: 5 (ref 5.0–8.0)

## 2021-10-14 LAB — CBC WITH DIFFERENTIAL/PLATELET
Abs Immature Granulocytes: 0.02 10*3/uL (ref 0.00–0.07)
Basophils Absolute: 0 10*3/uL (ref 0.0–0.1)
Basophils Relative: 0 %
Eosinophils Absolute: 0.1 10*3/uL (ref 0.0–0.5)
Eosinophils Relative: 1 %
HCT: 41.6 % (ref 39.0–52.0)
Hemoglobin: 13.8 g/dL (ref 13.0–17.0)
Immature Granulocytes: 0 %
Lymphocytes Relative: 24 %
Lymphs Abs: 2.2 10*3/uL (ref 0.7–4.0)
MCH: 28.5 pg (ref 26.0–34.0)
MCHC: 33.2 g/dL (ref 30.0–36.0)
MCV: 85.8 fL (ref 80.0–100.0)
Monocytes Absolute: 0.9 10*3/uL (ref 0.1–1.0)
Monocytes Relative: 9 %
Neutro Abs: 6.1 10*3/uL (ref 1.7–7.7)
Neutrophils Relative %: 66 %
Platelets: 215 10*3/uL (ref 150–400)
RBC: 4.85 MIL/uL (ref 4.22–5.81)
RDW: 14.7 % (ref 11.5–15.5)
WBC: 9.2 10*3/uL (ref 4.0–10.5)
nRBC: 0 % (ref 0.0–0.2)

## 2021-10-14 LAB — CHLAMYDIA/NGC RT PCR (ARMC ONLY)
Chlamydia Tr: NOT DETECTED
N gonorrhoeae: NOT DETECTED

## 2021-10-14 MED ORDER — ACETAMINOPHEN 500 MG PO TABS
1000.0000 mg | ORAL_TABLET | Freq: Once | ORAL | Status: AC
  Administered 2021-10-14: 1000 mg via ORAL
  Filled 2021-10-14: qty 2

## 2021-10-14 MED ORDER — SULFAMETHOXAZOLE-TRIMETHOPRIM 800-160 MG PO TABS: 1.0000 | ORAL_TABLET | Freq: Two times a day (BID) | ORAL | 0 refills | Status: AC

## 2021-10-14 MED ORDER — LIDOCAINE-EPINEPHRINE (PF) 2 %-1:200000 IJ SOLN
INTRAMUSCULAR | Status: AC
  Filled 2021-10-14: qty 20

## 2021-10-14 MED ORDER — CEPHALEXIN 500 MG PO CAPS: 500.0000 mg | ORAL_CAPSULE | Freq: Two times a day (BID) | ORAL | 0 refills | Status: AC

## 2021-10-14 MED ORDER — OXYCODONE HCL 5 MG PO TABS
5.0000 mg | ORAL_TABLET | Freq: Once | ORAL | Status: AC
  Administered 2021-10-14: 5 mg via ORAL
  Filled 2021-10-14: qty 1

## 2021-10-14 MED ORDER — LIDOCAINE-EPINEPHRINE 2 %-1:100000 IJ SOLN
20.0000 mL | Freq: Once | INTRAMUSCULAR | Status: AC
  Administered 2021-10-14: 20 mL

## 2021-10-14 MED ORDER — OXYCODONE HCL 5 MG PO TABS: 5.0000 mg | ORAL_TABLET | Freq: Four times a day (QID) | ORAL | 0 refills | Status: AC | PRN

## 2021-10-14 NOTE — ED Triage Notes (Signed)
Pt to ED via POV for abscess in his groin area. Pt states that it has been there for about 3 days. Pt states that he does not think it is draining. Pt is currently in NAD.

## 2021-10-14 NOTE — ED Provider Notes (Addendum)
[facility] Provider Note    Event Date/Time   First MD Initiated Contact with Patient 10/14/21 1341     (approximate)   History   Abscess   HPI  Jerry Bartlett is a 31 y.o. male otherwise healthy who comes in with concerns for abscess.  Patient reports some increased swelling noted in his left groin that is right near his scrotum.  He reports pain radiating up into the testicles.  He denies any fevers.  Has not been on any recent antibiotics.  Reviewed patient's records and back in January of 2021 patient had a right inguinal area nodule that they placed on Bactrim and naproxen and did not I&D.     Physical Exam   Triage Vital Signs: ED Triage Vitals [10/14/21 1317]  Enc Vitals Group     BP 127/75     Pulse Rate 90     Resp 16     Temp 98.1 F (36.7 C)     Temp Source Oral     SpO2 96 %     Weight 166 lb (75.3 kg)     Height 6\' 2"  (1.88 m)     Head Circumference      Peak Flow      Pain Score 10     Pain Loc      Pain Edu?      Excl. in GC?     Most recent vital signs: Vitals:   10/14/21 1317  BP: 127/75  Pulse: 90  Resp: 16  Temp: 98.1 F (36.7 C)  SpO2: 96%     General: Awake, no distress.  CV:  Good peripheral perfusion.  Resp:  Normal effort.  Abd:  No distention.  Soft and nontender Other:  Patient has area of fluctuation right underneath the left testicle- Seems superficial in nature.  Nurse 12/14/21 chaperoned exam-   ED Results / Procedures / Treatments   Labs (all labs ordered are listed, but only abnormal results are displayed) Labs Reviewed - No data to display    RADIOLOGY I have reviewed the ultrasound personally and interpreted complex fluid collection near the scrotum  Eileen Stanford PELVIS LIMITED (TRANSABDOMINAL ONLY) (Accession [place]) (Order [id number]) Imaging Date: 10/14/2021 Department: [facility] EMERGENCY DEPARTMENT Released By: [facility], RDMS Authorizing: Arnoldo Hooker,  MD   Exam Status  Status  Final [99]   PACS Intelerad Image Link   Show images for Concha Se PELVIS LIMITED (TRANSABDOMINAL ONLY) Study Result  Narrative & Impression  CLINICAL DATA:  Abscess sub a left groin/perineum for 4 days.   EXAM: ULTRASOUND OF left GROIN SOFT TISSUES   TECHNIQUE: Ultrasound examination of the groin soft tissues was performed in the area of clinical concern.   COMPARISON:  None Available.   FINDINGS: Patient's palpable abnormality corresponds to a thick rimmed complex fluid collection suspicious for an abscess. A partially liquified hematoma is also possible. This measures approximately 3.4 x 1.3 x 1.9 cm.   IMPRESSION: 3.4 x 1.3 x 1.9 cm complex thick rimmed fluid collection suspicious for abscess or partially liquified hematoma.    PROCEDURES:  Critical Care performed: No  ..Incision and Drainage  Date/Time: 10/14/2021 3:28 PM  Performed by: 12/14/2021, MD Authorized by: Concha Se, MD   Consent:    Consent obtained:  Verbal   Consent given by:  Patient   Risks discussed:  Bleeding, incomplete drainage, pain, damage to other organs and infection   Alternatives discussed:  No treatment Universal protocol:    Patient identity confirmed:  Verbally with patient Location:    Type:  Abscess   Size:  3.4 x 1.3 x 1.9 cm   Location:  Anogenital   Anogenital location:  Perineum Pre-procedure details:    Skin preparation:  Povidone-iodine Sedation:    Sedation type:  None Anesthesia:    Anesthesia method:  Local infiltration   Local anesthetic:  Lidocaine 1% WITH epi Procedure type:    Complexity:  Simple Procedure details:    Ultrasound guidance: no     Needle aspiration: no     Incision types:  Stab incision   Wound management:  Probed and deloculated and irrigated with saline   Drainage:  Purulent   Drainage amount:  Moderate   Packing materials:  1/4 in gauze Post-procedure details:    Procedure completion:  Tolerated well, no  immediate complications    MEDICATIONS ORDERED IN ED: Medications  oxyCODONE (Oxy IR/ROXICODONE) immediate release tablet 5 mg (5 mg Oral Given 10/14/21 1413)  acetaminophen (TYLENOL) tablet 1,000 mg (1,000 mg Oral Given 10/14/21 1414)     IMPRESSION / MDM / ASSESSMENT AND PLAN / ED COURSE  I reviewed the triage vital signs and the nursing notes.   Patient's presentation is most consistent with acute presentation with potential threat to life or bodily function.   Differential includes abscess, cellulitis.  Patient is well-appearing does not appear septic but will get labs in case he ends up needing a CT imaging.  We will start off with ultrasound though given it does look more superficial in nature to make sure it does not invade into the testicle area.  Patient will be handed off to oncoming team pending these results we will give a dose of oxycodone given patient reports being able to get a ride home.  IMPRESSION: 3.4 x 1.3 x 1.9 cm complex thick rimmed fluid collection suspicious for abscess or partially liquified hematoma.  CBC and CMP are reassuring.  I do not feel like this is extending into the scrotum and the ultrasound just shows the abscess.  I discussed with Dr. Apolinar Junes and she was okay with me doing bedside I&D.  He has had previous abscesses in these areas in the past do not see evidence of Fournier's gangrene or scrotal cellulitis requiring admission and patient is otherwise very well-appearing.  Packing was placed and patient will follow-up with Dr. Apolinar Junes and patient was started on some antibiotics.  Wound culture will be sent   FINAL CLINICAL IMPRESSION(S) / ED DIAGNOSES   Final diagnoses:  Abscess     Rx / DC Orders   ED Discharge Orders     None        Note:  This document was prepared using Dragon voice recognition software and may include unintentional dictation errors.   Concha Se, MD 10/14/21 1458    Concha Se, MD 10/14/21 [phone]

## 2021-10-14 NOTE — ED Notes (Signed)
Dc instructions and scripts reviewed with pt no questions or concerns at this time. Pt to follow up with urology

## 2021-10-14 NOTE — Discharge Instructions (Addendum)
Make sure you take the antibiotics.  You can take the pain medicine for breakthrough pain if Tylenol 1 g every 8 hours and ibuprofen 600 every 6-8 hours with food is not working.  Do not drive while on this.  We have left packing in place.  Please call urology to make a follow-up appointment and return to the ER if develop worsening symptoms or any other concerns  I would be worried if you develop redness of the testicles, fevers, worsening pain please return   Narrative & Impression  CLINICAL DATA:  Abscess sub a left groin/perineum for 4 days.   EXAM: ULTRASOUND OF left GROIN SOFT TISSUES   TECHNIQUE: Ultrasound examination of the groin soft tissues was performed in the area of clinical concern.   COMPARISON:  None Available.   FINDINGS: Patient's palpable abnormality corresponds to a thick rimmed complex fluid collection suspicious for an abscess. A partially liquified hematoma is also possible. This measures approximately 3.4 x 1.3 x 1.9 cm.   IMPRESSION: 3.4 x 1.3 x 1.9 cm complex thick rimmed fluid collection suspicious for abscess or partially liquified hematoma.    Take oxycodone as prescribed. Do not drink alcohol, drive or participate in any other potentially dangerous activities while taking this medication as it may make you sleepy. Do not take this medication with any other sedating medications, either prescription or over-the-counter. If you were prescribed Percocet or Vicodin, do not take these with acetaminophen (Tylenol) as it is already contained within these medications.  This medication is an opiate (or narcotic) pain medication and can be habit forming. Use it as little as possible to achieve adequate pain control. Do not use or use it with extreme caution if you have a history of opiate abuse or dependence. If you are on a pain contract with your primary care doctor or a pain specialist, be sure to let them know you were prescribed this medication today from the  Emergency Department. This medication is intended for your use only - do not give any to anyone else and keep it in a secure place where nobody else, especially children, have access to it.

## 2021-10-14 NOTE — ED Provider Triage Note (Signed)
Emergency Medicine Provider Triage Evaluation Note  Jerry Bartlett , a 31 y.o. male  was evaluated in triage.  Pt complains of abscess on the upper leg near the scrotum.  Review of Systems  Positive:  Negative:   Physical Exam  BP 127/75 (BP Location: Left Arm)   Pulse 90   Temp 98.1 F (36.7 C) (Oral)   Resp 16   Ht 6\' 2"  (1.88 m)   Wt 75.3 kg   SpO2 96%   BMI 21.31 kg/m  Gen:   Awake, no distress   Resp:  Normal effort  MSK:   Moves extremities without difficulty  Other:    Medical Decision Making  Medically screening exam initiated at 1:19 PM.  Appropriate orders placed.  Add Jerry Bartlett was informed that the remainder of the evaluation will be completed by another provider, this initial triage assessment does not replace that evaluation, and the importance of remaining in the ED until their evaluation is complete.     Lavone Neri, PA-C 10/14/21 1320

## 2021-10-17 LAB — AEROBIC/ANAEROBIC CULTURE W GRAM STAIN (SURGICAL/DEEP WOUND)
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
## 2023-07-27 ENCOUNTER — Other Ambulatory Visit: Payer: Self-pay

## 2023-07-27 ENCOUNTER — Emergency Department
Admission: EM | Admit: 2023-07-27 | Discharge: 2023-07-27 | Disposition: A | Discharge status: Home / Self Care | Payer: Self-pay | Attending: Emergency Medicine | Admitting: Emergency Medicine

## 2023-07-27 ENCOUNTER — Encounter: Payer: Self-pay | Admitting: Intensive Care

## 2023-07-27 DIAGNOSIS — L0291 Cutaneous abscess, unspecified: Secondary | ICD-10-CM

## 2023-07-27 DIAGNOSIS — L02214 Cutaneous abscess of groin: Secondary | ICD-10-CM | POA: Diagnosis present

## 2023-07-27 MED ORDER — CEPHALEXIN 500 MG PO CAPS: 500.0000 mg | ORAL_CAPSULE | Freq: Four times a day (QID) | ORAL | 0 refills | Status: AC

## 2023-07-27 MED ORDER — LIDOCAINE HCL (PF) 1 % IJ SOLN
5.0000 mL | Freq: Once | INTRAMUSCULAR | Status: AC
  Administered 2023-07-27: 5 mL via INTRADERMAL
  Filled 2023-07-27: qty 5

## 2023-07-27 MED ORDER — SULFAMETHOXAZOLE-TRIMETHOPRIM 800-160 MG PO TABS: 1.0000 | ORAL_TABLET | Freq: Two times a day (BID) | ORAL | 0 refills | Status: AC

## 2023-07-27 NOTE — ED Provider Notes (Signed)
 [facility] Provider Note    Event Date/Time   First MD Initiated Contact with Patient 07/27/23 1203     (approximate)   History   Abscess   HPI  Jerry Bartlett is a 33 y.o. male with PMH of abscesses who presents for evaluation of an abscess to the left groin.  Patient states that he first noticed it a few days ago.  He tried to pop it and opened it himself but was unable to.  Having a lot of pain to the area. No testicular pain or urinary symptoms.      Physical Exam   Triage Vital Signs: ED Triage Vitals  Encounter Vitals Group     BP 07/27/23 1147 (!) 143/97     Girls Systolic BP Percentile --      Girls Diastolic BP Percentile --      Boys Systolic BP Percentile --      Boys Diastolic BP Percentile --      Pulse Rate 07/27/23 1147 67     Resp 07/27/23 1147 16     Temp 07/27/23 1147 98.1 F (36.7 C)     Temp Source 07/27/23 1147 Oral     SpO2 07/27/23 1147 100 %     Weight 07/27/23 1148 175 lb (79.4 kg)     Height 07/27/23 1148 6' 2 (1.88 m)     Head Circumference --      Peak Flow --      Pain Score 07/27/23 1148 10     Pain Loc --      Pain Education --      Exclude from Growth Chart --     Most recent vital signs: Vitals:   07/27/23 1147  BP: (!) 143/97  Pulse: 67  Resp: 16  Temp: 98.1 F (36.7 C)  SpO2: 100%   General: Awake, no distress.  CV:  Good peripheral perfusion.  Resp:  Normal effort.  Abd:  No distention.  Other:  Fluctuant mass suprapubically on the left side, very tender to palpation   ED Results / Procedures / Treatments   Labs (all labs ordered are listed, but only abnormal results are displayed) Labs Reviewed - No data to display  PROCEDURES:  Critical Care performed: No  .Incision and Drainage  Date/Time: 07/27/2023 1:56 PM  Performed by: Phyliss Breen, PA-C Authorized by: Phyliss Breen, PA-C   Consent:    Consent obtained:  Verbal   Consent given by:  Patient   Risks,  benefits, and alternatives were discussed: yes     Risks discussed:  Bleeding, incomplete drainage, pain and infection   Alternatives discussed:  Alternative treatment Universal protocol:    Patient identity confirmed:  Verbally with patient Location:    Type:  Abscess   Size:  2 cm   Location:  Anogenital   Anogenital location: suprapubic. Pre-procedure details:    Skin preparation:  Povidone-iodine Sedation:    Sedation type:  None Anesthesia:    Anesthesia method:  Local infiltration   Local anesthetic:  Lidocaine  1% w/o epi Procedure type:    Complexity:  Simple Procedure details:    Incision types:  Stab incision   Incision depth:  Dermal   Wound management:  Probed and deloculated and irrigated with saline   Drainage:  Purulent   Drainage amount:  Moderate   Wound treatment:  Wound left open   Packing materials:  None Post-procedure details:    Procedure completion:  Tolerated  well, no immediate complications    MEDICATIONS ORDERED IN ED: Medications  lidocaine  (PF) (XYLOCAINE ) 1 % injection 5 mL (5 mLs Intradermal Given 07/27/23 1337)     IMPRESSION / MDM / ASSESSMENT AND PLAN / ED COURSE  I reviewed the triage vital signs and the nursing notes.                             33 year old male presents for evaluation of a left groin abscess.  Blood pressure is a bit elevated but patient is quite uncomfortable on exam.  Vital signs stable otherwise.  Differential diagnosis includes, but is not limited to, cellulitis, folliculitis, abscess.  Patient's presentation is most consistent with acute, uncomplicated illness.  Patient's presentation is consistent with an abscess supra pubically, there is no overlying cellulitis and does not extend into the scrotum. Recommended incision and drainage.  Patient was agreeable to plan.  Please see procedure note for further details.  Did offer to pack the wound for patient to encourage drainage but patient declined stating he did  not think he could tolerate the pain.  We discussed wound care.  Will place patient on oral antibiotics.  Recommended using an antibacterial soap.  Patient voiced understanding, all questions were answered and he was stable at discharge.      FINAL CLINICAL IMPRESSION(S) / ED DIAGNOSES   Final diagnoses:  Abscess     Rx / DC Orders   ED Discharge Orders          Ordered    cephALEXin  (KEFLEX ) 500 MG capsule  4 times daily        07/27/23 1403    sulfamethoxazole -trimethoprim  (BACTRIM  DS) 800-160 MG tablet  2 times daily        07/27/23 1403             Note:  This document was prepared using Dragon voice recognition software and may include unintentional dictation errors.   Phyliss Breen, PA-C 07/27/23 1405    Lubertha Rush, MD 07/27/23 [phone]

## 2023-07-27 NOTE — ED Triage Notes (Signed)
 Patient presents with left groin abscess. History of same

## 2023-07-27 NOTE — ED Notes (Signed)
 Pt verbalizes understanding of discharge instructions. Opportunity for questioning and answers were provided. Pt discharged from ED to home.   ? ?

## 2023-07-27 NOTE — Discharge Instructions (Addendum)
 Please take the antibiotics as prescribed.  You can have Tylenol  and ibuprofen  as needed for pain.  Use warm compresses to encourage this abscess to continue to drain.  You may notice some bleeding, this is normal.  You can cover with a bandage or gauze.  I recommend washing with an antibacterial or antimicrobial soap to help prevent development of abscesses in the future.  Return to the emergency department with any worsening symptoms.
